# Patient Record
Sex: Female | Born: 1955 | Race: White | Hispanic: No | Marital: Single | Smoking: Never smoker
Health system: Southern US, Community
[De-identification: ages and names within clinical notes are randomized; demographics above are authoritative.]

## PROBLEM LIST (undated history)

## (undated) DIAGNOSIS — G8929 Other chronic pain: Secondary | ICD-10-CM

## (undated) DIAGNOSIS — T1491XA Suicide attempt, initial encounter: Secondary | ICD-10-CM

## (undated) DIAGNOSIS — Z765 Malingerer [conscious simulation]: Secondary | ICD-10-CM

## (undated) DIAGNOSIS — IMO0002 Reserved for concepts with insufficient information to code with codable children: Secondary | ICD-10-CM

## (undated) DIAGNOSIS — E785 Hyperlipidemia, unspecified: Secondary | ICD-10-CM

## (undated) DIAGNOSIS — F329 Major depressive disorder, single episode, unspecified: Secondary | ICD-10-CM

## (undated) DIAGNOSIS — F419 Anxiety disorder, unspecified: Secondary | ICD-10-CM

## (undated) HISTORY — DX: Reserved for concepts with insufficient information to code with codable children: IMO0002

## (undated) HISTORY — PX: ECTOPIC PREGNANCY SURGERY: SHX613

## (undated) HISTORY — DX: Anxiety disorder, unspecified: F41.9

## (undated) HISTORY — PX: CHOLECYSTECTOMY: SHX55

## (undated) HISTORY — DX: Hyperlipidemia, unspecified: E78.5

## (undated) HISTORY — PX: TONSILLECTOMY: SUR1361

---

## 2006-11-14 ENCOUNTER — Ambulatory Visit: Payer: Self-pay | Admitting: Obstetrics and Gynecology

## 2006-11-14 ENCOUNTER — Encounter: Payer: Self-pay | Admitting: Obstetrics and Gynecology

## 2006-11-14 ENCOUNTER — Other Ambulatory Visit: Admission: RE | Admit: 2006-11-14 | Discharge: 2006-11-14 | Payer: Self-pay | Admitting: Obstetrics and Gynecology

## 2006-11-20 ENCOUNTER — Ambulatory Visit (HOSPITAL_COMMUNITY): Admission: RE | Admit: 2006-11-20 | Discharge: 2006-11-20 | Payer: Self-pay | Admitting: Gynecology

## 2006-12-26 ENCOUNTER — Other Ambulatory Visit: Admission: RE | Admit: 2006-12-26 | Discharge: 2006-12-26 | Payer: Self-pay | Admitting: Obstetrics and Gynecology

## 2006-12-26 ENCOUNTER — Encounter: Payer: Self-pay | Admitting: Obstetrics & Gynecology

## 2006-12-26 ENCOUNTER — Ambulatory Visit: Payer: Self-pay | Admitting: Obstetrics & Gynecology

## 2007-01-16 ENCOUNTER — Ambulatory Visit: Payer: Self-pay | Admitting: Obstetrics & Gynecology

## 2007-11-07 ENCOUNTER — Ambulatory Visit: Payer: Self-pay | Admitting: Obstetrics & Gynecology

## 2007-11-07 ENCOUNTER — Encounter: Payer: Self-pay | Admitting: Obstetrics & Gynecology

## 2008-06-11 ENCOUNTER — Inpatient Hospital Stay (HOSPITAL_COMMUNITY): Admission: AD | Admit: 2008-06-11 | Discharge: 2008-06-11 | Payer: Self-pay | Admitting: Obstetrics and Gynecology

## 2008-07-19 IMAGING — US US TRANSVAGINAL NON-OB
1 series · 14 of 25 positions shown · non-contrast
Comparison: No prior studies are available for comparison.

CLINICAL DATA: 51-year-old with pelvic pain, passing clots. 
TRANSABDOMINAL AND TRANSVAGINAL PELVIC ULTRASOUND:
TECHNIQUE: Both transabdominal and transvaginal ultrasound examinations of the pelvis were performed including evaluation of the uterus, ovaries, adnexal regions, and pelvic cul-de-sac.

[Series 1: us transvaginal non-ob · 0.32mm/px · 14 of 34 slices shown]
[im 1/34]
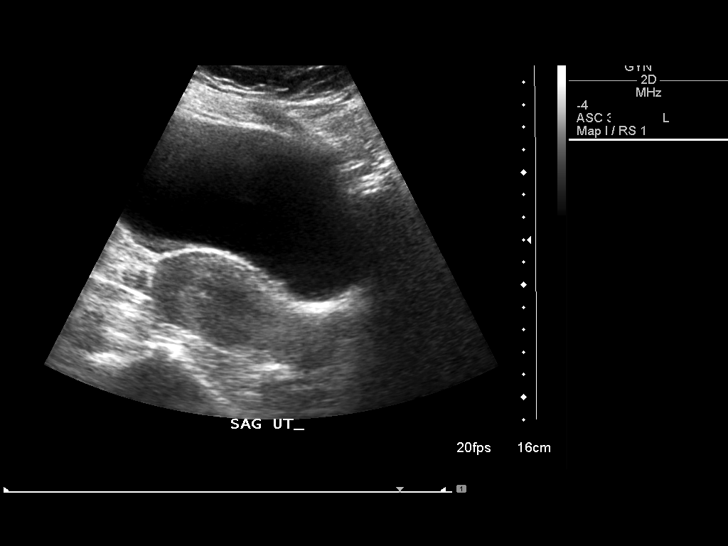
[im 3/34]
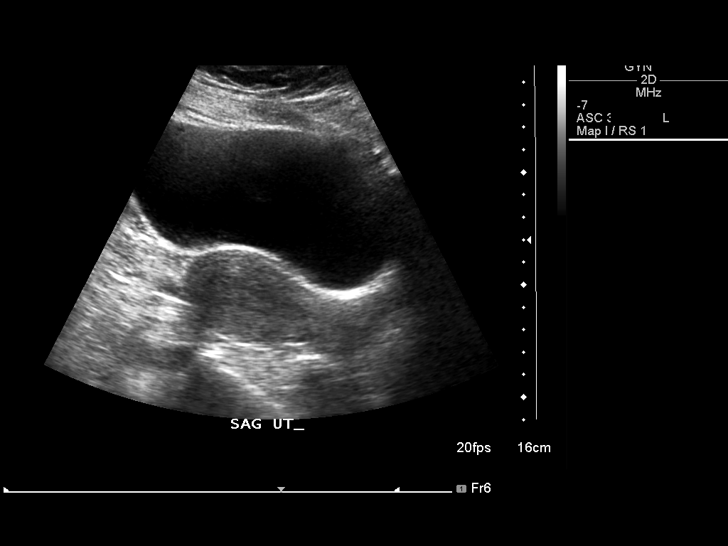
[im 6/34]
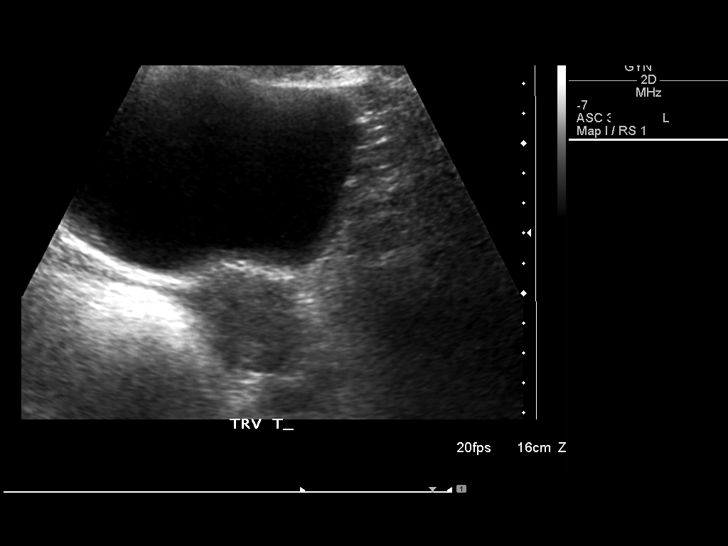
[im 9/34]
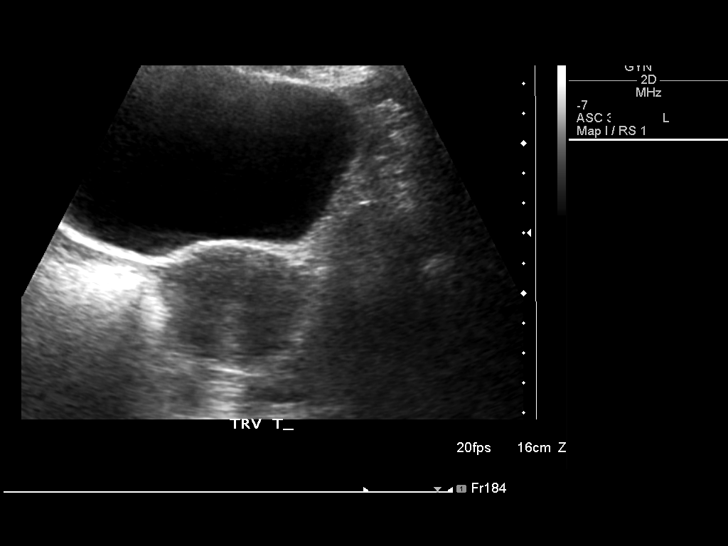
[im 12/34]
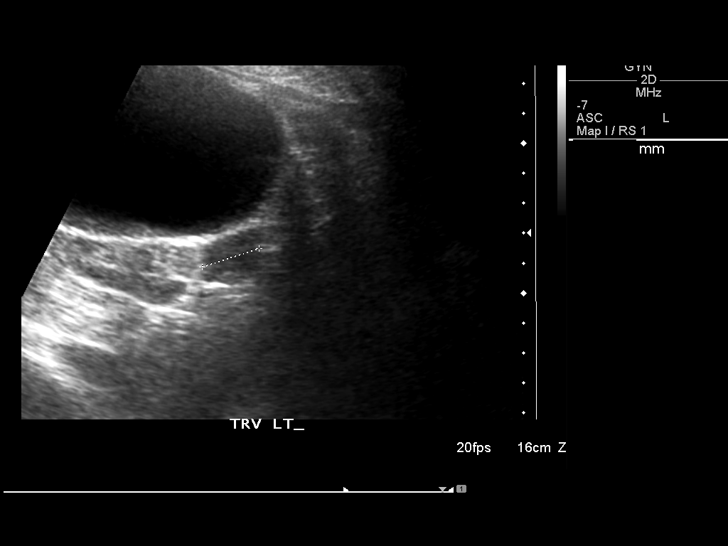
[im 13/34]
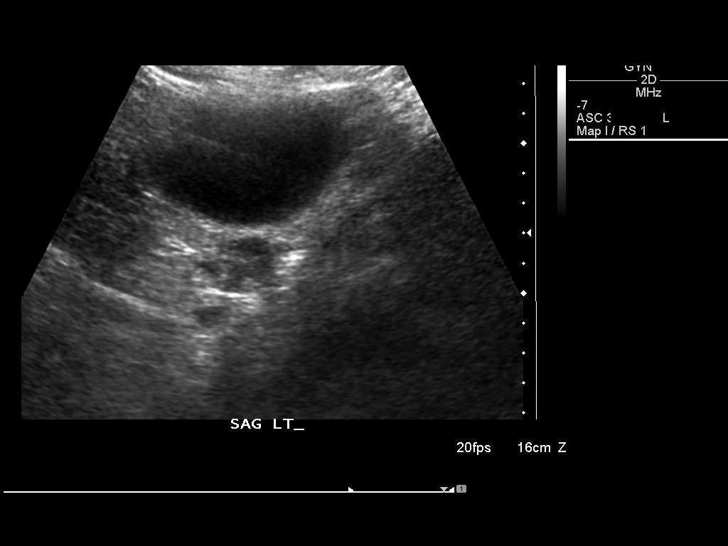
[im 16/34]
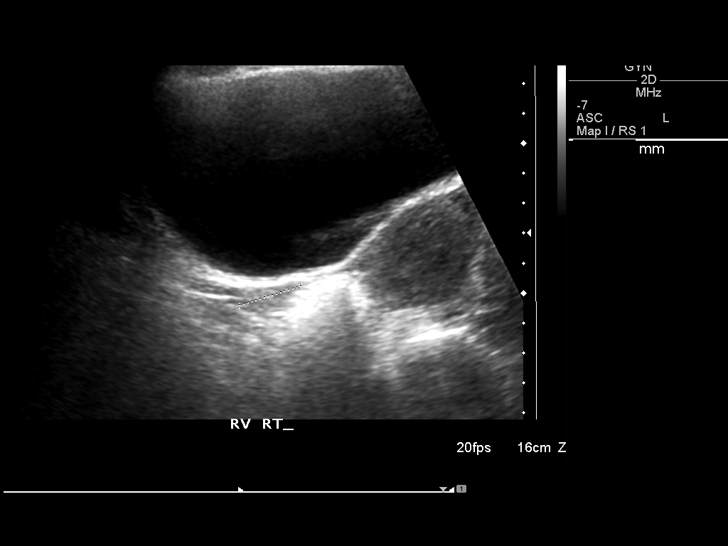
[im 18/34]
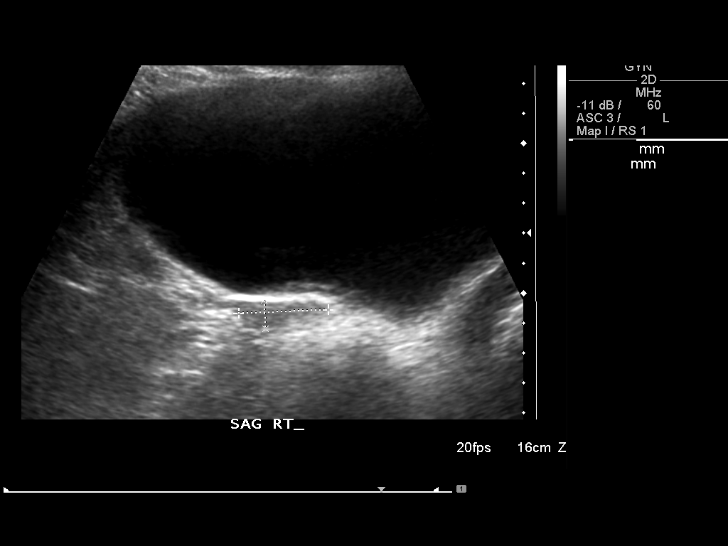
[im 21/34]
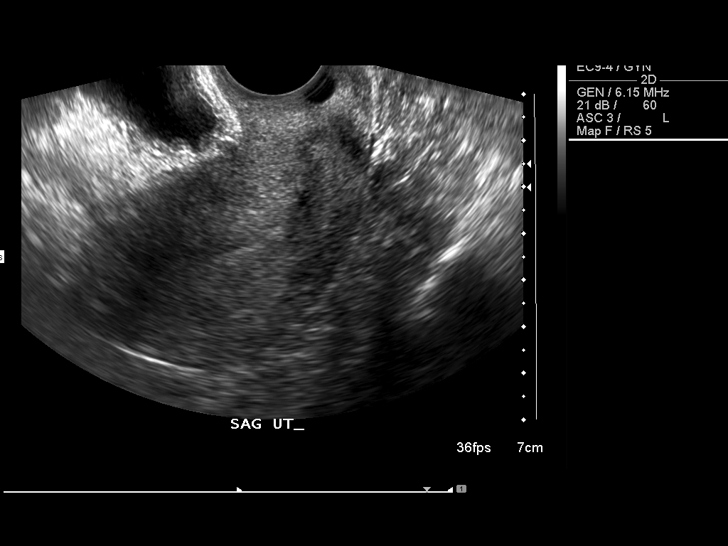
[im 23/34]
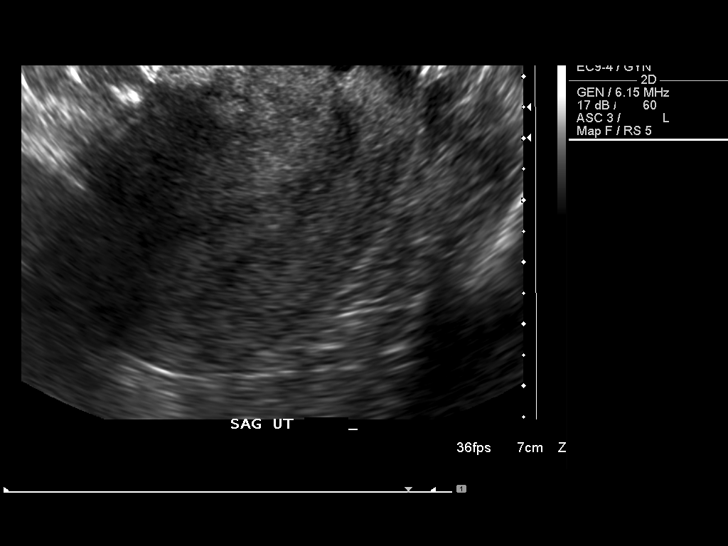
[im 25/34]
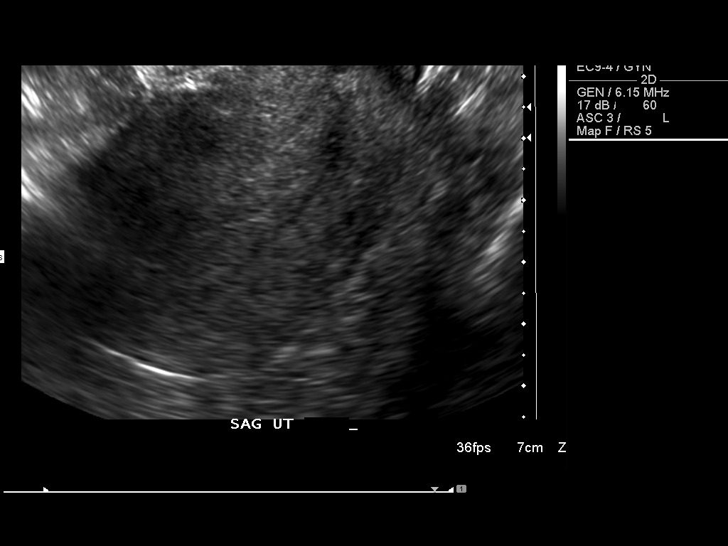
[im 28/34]
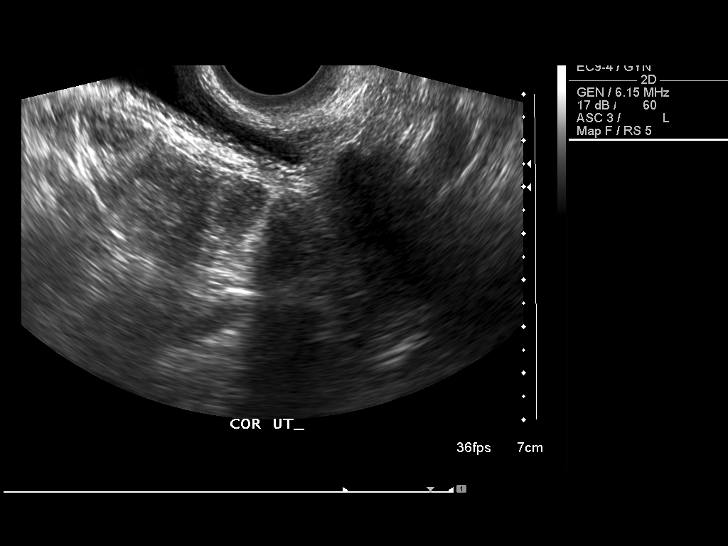
[im 31/34]
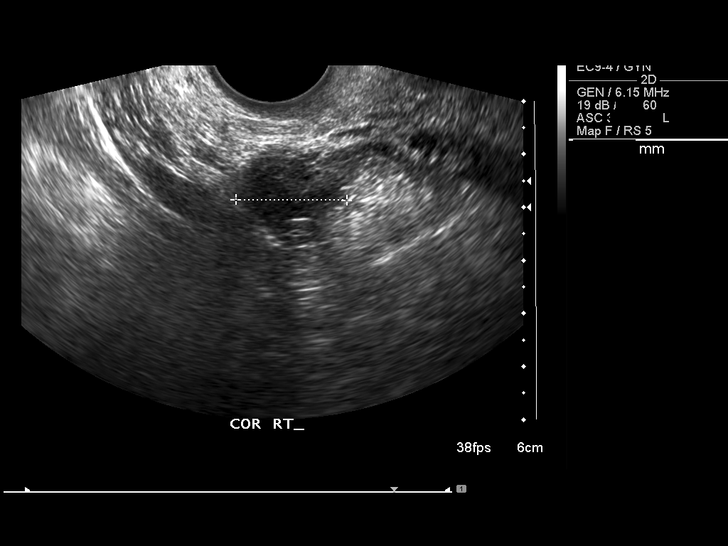
[im 34/34]
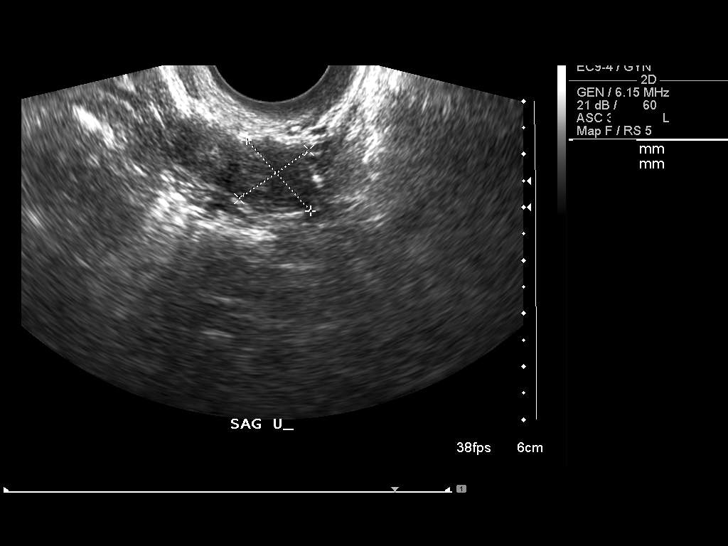

[14 of 25 positions shown; findings below may reference images not displayed]

FINDINGS: The uterus measures 8.1 cm in length x 4.6 cm AP diameter x 4.8 cm in width. The uterine contour is normal and no focal uterine masses are identified. The endometrium measures 9.3 mm in greatest thickness.
Both ovaries are normal in size and sonographic appearance measuring 1.9 x 1.8 x 2.0 cm on the right and 2.2 x 3.0 x 1.0 cm on the left.  No significant free fluid identified in the cul-de-sac.
IMPRESSION: 1.  The endometrial thickness is 9 mm.
2.  Normal sonographic appearances of the ovaries.

## 2009-07-14 ENCOUNTER — Ambulatory Visit: Payer: Self-pay | Admitting: Obstetrics & Gynecology

## 2009-07-14 LAB — CONVERTED CEMR LAB: Pap Smear: NEGATIVE

## 2010-03-05 ENCOUNTER — Encounter: Payer: Self-pay | Admitting: Internal Medicine

## 2010-03-05 ENCOUNTER — Encounter: Payer: Self-pay | Admitting: Obstetrics and Gynecology

## 2010-05-24 LAB — CBC
MCV: 91.2 fL (ref 78.0–100.0)
Platelets: 225 10*3/uL (ref 150–400)
RBC: 3.72 MIL/uL — ABNORMAL LOW (ref 3.87–5.11)
WBC: 7 10*3/uL (ref 4.0–10.5)

## 2010-06-27 NOTE — Group Therapy Note (Signed)
[name], [name]                 ACCOUNT NO.:  [id number]   MEDICAL RECORD NO.:  [id number]          PATIENT TYPE:  WOC   LOCATION:  [facility]                   FACILITY:  [facility]   PHYSICIAN:  Odie Sera, D.O.    DATE OF BIRTH:  03-18-1955   DATE OF SERVICE:  05/07/2008                                  CLINIC NOTE   The patient returns for a repeat Pap smear after abnormal Pap smear  consistent with ASCUS in 2008.  The patient reports that she is still  having abnormal periods but not currently on __________.  Her last  period was April 15, 2008.  Her last Pap smear was January 07, 2008, and  was normal.   VITALS:  Pulses 123, blood pressure 130/85, weight 167, height 4 feet 11  inches.   GENERAL EXAM:  The patient is in no apparent distress, pleasant, and  mildly overweight.  Pap smear not performed at this visit.  Pelvic exam  not performed at this visit.   ASSESSMENT AND PLAN:  This is a 55 year old female with history of  abnormal Pap smear consistent with ASCUS with 2 consecutive normal Pap  smears and a normal colposcopy.  The patient was discussed with both,  Dr. Debroah Loop and Dr. Noel Gerold, decided that with the normal repeat Pap smears  and the normal colposcopy, she does not need any repeat Pap smear at  this time.  The patient will not be charged for this visit.  She will  return in November 2010 for her annual Pap smear and pelvic exam at that  time.     ______________________________  [name]    ______________________________  Odie Sera, D.O.    DU/MEDQ  D:  05/07/2008  T:  05/08/2008  Job:  [id number]

## 2010-06-27 NOTE — Group Therapy Note (Signed)
[name], [name]                 ACCOUNT NO.:  [id number]   MEDICAL RECORD NO.:  [id number]          PATIENT TYPE:  WOC   LOCATION:  [facility]                   FACILITY:  [facility]   PHYSICIAN:  Argentina Donovan, MD        DATE OF BIRTH:  09/05/1955   DATE OF SERVICE:                                  CLINIC NOTE   This patient is a 55 year old Caucasian female gravida 1, para 0-0-1-0  with a history of an ectopic pregnancy years ago.  Has been sexually  inactive for the last 15 years and recently has been seen at the [facility] after having for many years been a patient of  Dr. __________ .  For the past 3 years she has had increasingly  difficult periods with heavy clotting and severe cramping but  continually she has had right lower quadrant pain down in the inguinal  area that is sharp but almost always there and not related to her cycle.  The Pap smear that the patient had had recently I am not sure whether it  was abnormal or not but apparently in a short note we got from Dr.  __________  indicates he wanted a colposcopy so it may very well be, we  are trying to obtain those records and if they are abnormal we will set  her up for colposcopy on her next visit when she comes back for follow-  up.  I did an endometrial biopsy today and in doing that I sounded the  uterus to 7 sonometers, retroverted.  This was not easy in this  nulliparous patient, but she tolerated it very well.  We got very little  tissue back and I expect that that is going to turn out to be normal.  I  Thought probably we would try and treat this lady, once we know that  everything is okay in the endometrium, with oral contraceptives or an  IUD to control her bleeding until menopause ensues.  However, the right  lower quadrant pain concerns me because she is tender to palpation in  that area.  I have had her laying down, standing up, coughing and I  cannot demonstrate a hernia in that particular  area, but the pain she is  demonstrating is very similar to what I would think would be manifested  with a superficial hernia and may be occult so the consideration for the  future about a laparoscopy I think is real.   IMPRESSION:  Right lower quadrant continual pain not related to cycle,  dysfunctional uterine bleeding probably hormonal and perimenopausal with  dysmenorrhea.  The patient is a nonsmoker.  She is 4 foot 10 inches  tall, weighs 162 pounds.   EXAMINATION:  ABDOMEN:  Soft, flat, nontender except in the inguinal  area on the right with no guarding or rebound.  EXTERNAL GENITALIA:  Normal, BUS within normal limits.  Vagina is clean  and well rugated.  The cervix clean, nulliparous.  Uterus is normal  size, shape, consistency and first degree retroversion.  The adnexa  could not be well palpated.  IMPRESSION:  Dysmenorrhea, dysfunctional uterine bleeding and right  lower quadrant steady pain not related to cycle.           ______________________________  Argentina Donovan, MD     PR/MEDQ  D:  11/14/2006  T:  11/15/2006  Job:  [id number]

## 2010-06-27 NOTE — Group Therapy Note (Signed)
[name], [name]                 ACCOUNT NO.:  [id number]   MEDICAL RECORD NO.:  [id number]          PATIENT TYPE:  WOC   LOCATION:  [facility]                   FACILITY:  [facility]   PHYSICIAN:  Johnella Moloney, MD        DATE OF BIRTH:  [birth year]   DATE OF SERVICE:  11/07/2007                                  CLINIC NOTE   REASON FOR OFFICE VISIT:  Repeat Pap smear status post normal Pap smear  in November 2008.   HISTORY:  The patient is a 55 year old perimenopausal female who  presents today for repeat Pap smear.  The patient had an abnormal Pap  previously and was set up for colposcopy in November 2008.  Coloposcopy  revealed no acetowhite lesions and no biopsy was necessary.  A repeat  Pap was obtained at that time and was negative for any intraepithelial  lesions.  An endometrial biopsy was also performed at that time and was  found to be within normal limits.  The patient returns today for  followup Pap smear for continued surveillance of positive screening test  that would indicate further need for cervical testing.  The patient  states that she has intermittent right lower quadrant pain.  That is  worsened when she obtains her periods.  She also notices that her  periods are becoming more and more irregular as she goes through her  perimenopausal state.  Again, endometrial biopsy was obtained in  November 2008 and found to be normal.   PHYSICAL EXAMINATION:  VITAL SIGNS:  Temperature is 98.0, pulse 108,  blood pressure 118/82, weight 168.8 pounds, and height is 69 inches.  GENERAL:  The patient is an obese, well-appearing Caucasian female in no  acute distress.  She is cooperative with the exam.  UROGENITAL:  External female anatomies within normal limits.  Vaginal  mucosa is pink and moist.  There is physiologic discharge within the  vaginal vault.  Cervix is slightly posterior and towards the patient's  right, but is found without difficult.  The os is closed and Pap smear  is obtained.  Bimanual examination reveals some right lower quadrant  tenderness on superficial palpation.  There is no adnexal mass on the  right or left side.  Uterus is within normal limits and is mobile and  nontender.  There is no cervical motion tenderness as well.  The patient  tolerated the exam without difficulty.   ASSESSMENT AND PLAN:  The patient is a 55 year old Caucasian female with  an abnormal Pap smear status post normal Pap in November 2008.  She  returns today for followup Pap smear for further monitoring with  previously abnormal Pap.  Coloposcopy was normal in November 2008 as  well.  We will notify the patient with the results of this Pap smear and  if normal, we will plan to repeat Pap in 6 months.  If that third and  final Pap smear is within normal limits, the patient can go back to  annual screening.  This had been discussed with the patient in detail.  She is in agreement with this plan.  DISPOSITION:  The patient is to follow up in 6 months for Pap smear  unless Pap smear performed today is abnormal and a colposcopy is  indicated.     ______________________________  Myrtie Soman, MD    ______________________________  Johnella Moloney, MD    TE/MEDQ  D:  11/07/2007  T:  11/08/2007  Job:  [id number]

## 2010-11-21 LAB — POCT PREGNANCY, URINE: Preg Test, Ur: NEGATIVE

## 2010-11-23 LAB — POCT PREGNANCY, URINE: Preg Test, Ur: NEGATIVE

## 2013-03-23 ENCOUNTER — Other Ambulatory Visit (HOSPITAL_COMMUNITY): Payer: Self-pay | Admitting: *Deleted

## 2013-03-23 DIAGNOSIS — Z1231 Encounter for screening mammogram for malignant neoplasm of breast: Secondary | ICD-10-CM

## 2013-04-09 ENCOUNTER — Ambulatory Visit: Payer: Self-pay | Admitting: Nurse Practitioner

## 2013-04-09 ENCOUNTER — Ambulatory Visit (HOSPITAL_COMMUNITY): Payer: Self-pay

## 2013-04-15 ENCOUNTER — Ambulatory Visit (HOSPITAL_COMMUNITY): Payer: Self-pay

## 2013-04-15 ENCOUNTER — Other Ambulatory Visit (HOSPITAL_COMMUNITY): Payer: Self-pay | Admitting: *Deleted

## 2013-04-15 DIAGNOSIS — Z1231 Encounter for screening mammogram for malignant neoplasm of breast: Secondary | ICD-10-CM

## 2013-05-28 ENCOUNTER — Ambulatory Visit: Payer: Self-pay | Admitting: Nurse Practitioner

## 2013-06-25 ENCOUNTER — Ambulatory Visit: Payer: Self-pay | Admitting: Medical

## 2013-08-06 ENCOUNTER — Ambulatory Visit (HOSPITAL_COMMUNITY)
Admission: RE | Admit: 2013-08-06 | Discharge: 2013-08-06 | Disposition: A | Discharge status: Home / Self Care | Payer: Medicare Other | Source: Ambulatory Visit | Attending: *Deleted | Admitting: *Deleted

## 2013-08-06 ENCOUNTER — Encounter: Payer: Self-pay | Admitting: Obstetrics & Gynecology

## 2013-08-06 ENCOUNTER — Ambulatory Visit (INDEPENDENT_AMBULATORY_CARE_PROVIDER_SITE_OTHER): Payer: Medicare Other | Admitting: Obstetrics & Gynecology

## 2013-08-06 ENCOUNTER — Other Ambulatory Visit (HOSPITAL_COMMUNITY)
Admission: RE | Admit: 2013-08-06 | Discharge: 2013-08-06 | Disposition: A | Discharge status: Home / Self Care | Payer: Medicare Other | Source: Ambulatory Visit | Attending: Obstetrics & Gynecology | Admitting: Obstetrics & Gynecology

## 2013-08-06 VITALS — BP 117/80 | HR 103 | Ht 59.0 in | Wt 156.6 lb

## 2013-08-06 DIAGNOSIS — Z0000 Encounter for general adult medical examination without abnormal findings: Secondary | ICD-10-CM

## 2013-08-06 DIAGNOSIS — Z1231 Encounter for screening mammogram for malignant neoplasm of breast: Secondary | ICD-10-CM

## 2013-08-06 DIAGNOSIS — Z1151 Encounter for screening for human papillomavirus (HPV): Secondary | ICD-10-CM | POA: Insufficient documentation

## 2013-08-06 DIAGNOSIS — Z124 Encounter for screening for malignant neoplasm of cervix: Secondary | ICD-10-CM | POA: Insufficient documentation

## 2013-08-06 NOTE — Addendum Note (Signed)
Addended by: Faythe CasaBELLAMY, JEANETTA M on: 08/06/2013 04:32 PM   Modules accepted: Orders

## 2013-08-06 NOTE — Progress Notes (Signed)
Subjective:    Natalie KendallLinda Doyle is a 58 y.o. female who presents for an annual exam. The patient has no complaints today. The patient is not currently sexually active. GYN screening history: last pap: was normal. The patient wears seatbelts: yes. The patient participates in regular exercise: no. Has the patient ever been transfused or tattooed?: not asked. The patient reports that there is not domestic violence in her life.   Menstrual History: OB History   Grav Para Term Preterm Abortions TAB SAB Ect Mult Living   1    1   1   0      Menarche age: [age]  No LMP recorded. Patient is postmenopausal.    The following portions of the patient's history were reviewed and updated as appropriate: allergies, current medications, past family history, past medical history, past social history, past surgical history and problem list.  Review of Systems A comprehensive review of systems was negative.  Her mammogram is today.   Objective:    BP 117/80  Pulse 103  Ht 4\' 11"  (1.499 m)  Wt 156 lb 9.6 oz (71.033 kg)  BMI 31.61 kg/m2  General Appearance:    Alert, cooperative, no distress, appears stated age  Head:    Normocephalic, without obvious abnormality, atraumatic  Eyes:    PERRL, conjunctiva/corneas clear, EOM's intact, fundi    benign, both eyes  Ears:    Normal TM's and external ear canals, both ears  Nose:   Nares normal, septum midline, mucosa normal, no drainage    or sinus tenderness  Throat:   Lips, mucosa, and tongue normal; teeth and gums normal  Neck:   Supple, symmetrical, trachea midline, no adenopathy;    thyroid:  no enlargement/tenderness/nodules; no carotid   bruit or JVD  Back:     Symmetric, no curvature, ROM normal, no CVA tenderness  Lungs:     Clear to auscultation bilaterally, respirations unlabored  Chest Wall:    No tenderness or deformity   Heart:    Regular rate and rhythm, S1 and S2 normal, no murmur, rub   or gallop  Breast Exam:    No tenderness, masses, or nipple  abnormality  Abdomen:     Soft, non-tender, bowel sounds active all four quadrants,    no masses, no organomegaly, morbid centripital obesity  Genitalia:    Normal female without lesion, discharge or tenderness, no pelvic masses appreciated     Extremities:   Extremities normal, atraumatic, no cyanosis or edema  Pulses:   2+ and symmetric all extremities  Skin:   Skin color, texture, turgor normal, no rashes or lesions  Lymph nodes:   Cervical, supraclavicular, and axillary nodes normal  Neurologic:   CNII-XII intact, normal strength, sensation and reflexes    throughout  .    Assessment:    Healthy female exam.    Plan:     Breast self exam technique reviewed and patient encouraged to perform self-exam monthly. Mammogram. Thin prep Pap smear. with cotesting

## 2013-08-07 LAB — CYTOLOGY - PAP

## 2013-08-10 ENCOUNTER — Other Ambulatory Visit: Payer: Self-pay | Admitting: *Deleted

## 2013-08-10 DIAGNOSIS — N63 Unspecified lump in unspecified breast: Secondary | ICD-10-CM

## 2013-08-27 ENCOUNTER — Other Ambulatory Visit: Payer: Medicare Other

## 2013-12-14 ENCOUNTER — Encounter: Payer: Self-pay | Admitting: Obstetrics & Gynecology

## 2017-10-24 ENCOUNTER — Emergency Department (HOSPITAL_BASED_OUTPATIENT_CLINIC_OR_DEPARTMENT_OTHER)
Admission: EM | Admit: 2017-10-24 | Discharge: 2017-10-24 | Disposition: A | Discharge status: Home / Self Care | Payer: Medicare Other | Attending: Emergency Medicine | Admitting: Emergency Medicine

## 2017-10-24 ENCOUNTER — Other Ambulatory Visit: Payer: Self-pay

## 2017-10-24 ENCOUNTER — Encounter (HOSPITAL_BASED_OUTPATIENT_CLINIC_OR_DEPARTMENT_OTHER): Payer: Self-pay

## 2017-10-24 DIAGNOSIS — M79603 Pain in arm, unspecified: Secondary | ICD-10-CM | POA: Diagnosis present

## 2017-10-24 DIAGNOSIS — Z79899 Other long term (current) drug therapy: Secondary | ICD-10-CM | POA: Insufficient documentation

## 2017-10-24 DIAGNOSIS — G8929 Other chronic pain: Secondary | ICD-10-CM | POA: Diagnosis not present

## 2017-10-24 HISTORY — DX: Major depressive disorder, single episode, unspecified: F32.9

## 2017-10-24 HISTORY — DX: Malingerer (conscious simulation): Z76.5

## 2017-10-24 HISTORY — DX: Other chronic pain: G89.29

## 2017-10-24 HISTORY — DX: Suicide attempt, initial encounter: T14.91XA

## 2017-10-24 NOTE — ED Provider Notes (Signed)
[facility] EMERGENCY DEPARTMENT Provider Note  CSN: [id number] Arrival date & time: 10/24/17  1824    History   Chief Complaint Chief Complaint  Patient presents with  . Pain    HPI [name] is a 62 y.o. female with a medical history of chronic pain (2/2 fibromyalgia, DDD and OA) and psychiatric history of MDD and anxiety who presented to the ED requesting pain medications. Patient complains of pain, especially in her upper extremities which is chronic and unchanged. She describes it as throbbing, aching and burning. Denies recent trauma, injuries or falls. No worsening factors, but reports past relief with opiates. Patient spends most of the conversation discussing how and why she needs opiates and that she was discharged from her pain clinic last month in 09/2017. Recently ran out of her pain medication last week.   Past Medical History:  Diagnosis Date  . Anxiety   . Chronic pain   . Degenerative disc disease   . Drug-seeking behavior   . Hyperlipidemia   . Major depressive disorder   . Suicide attempt (HCC)     There are no active problems to display for this patient.   Past Surgical History:  Procedure Laterality Date  . CHOLECYSTECTOMY    . ECTOPIC PREGNANCY SURGERY    . TONSILLECTOMY       OB History    Gravida  1   Para      Term      Preterm      AB  1   Living  0     SAB      TAB      Ectopic  1   Multiple      Live Births               Home Medications    Prior to Admission medications   Medication Sig Start Date End Date Taking? Authorizing Provider  venlafaxine XR (EFFEXOR-XR) 75 MG 24 hr capsule Take 75 mg by mouth daily with breakfast.   Yes [provider]  atorvastatin (LIPITOR) 40 MG tablet Take 40 mg by mouth daily.    [provider]  oxyCODONE-acetaminophen (PERCOCET/ROXICET) 5-325 MG per tablet Take 1-2 tablets by mouth every 6 (six) hours as needed for severe pain.    [provider]    Family History Family History  Problem Relation Age of Onset  . Hyperlipidemia Mother   . COPD Mother     Social History Social History   Tobacco Use  . Smoking status: Never Smoker  . Smokeless tobacco: Never Used  Substance Use Topics  . Alcohol use: No  . Drug use: No     Allergies   Penicillins and Sulfa antibiotics   Review of Systems Review of Systems  Constitutional: Negative.   HENT: Negative.   Respiratory: Negative.   Cardiovascular: Negative.   Gastrointestinal: Negative.   Genitourinary: Negative.   Musculoskeletal: Positive for arthralgias and myalgias.  Skin: Negative.   Neurological: Negative.   Hematological: Negative.   Psychiatric/Behavioral: The patient is nervous/anxious.      Physical Exam Updated Vital Signs BP (!) 142/90 (BP Location: Left Arm)   Pulse (!) 112   Temp 98.4 F (36.9 C) (Oral)   Resp 18   Ht 4\' 11"  (1.499 m)   Wt 69.4 kg   SpO2 99%   BMI 30.90 kg/m   Physical Exam  Constitutional: She appears well-developed and well-nourished. No distress.  Neck: Normal range of  motion and full passive range of motion without pain. Neck supple.  Cardiovascular: Normal rate, regular rhythm and normal heart sounds.  Pulmonary/Chest: Effort normal and breath sounds normal.  Musculoskeletal:  Full ROM of upper and lower extremities with 5/5 strength. Muscular tenderness of right forearm.   Neurological: She has normal strength. No sensory deficit. She exhibits normal muscle tone.  Skin: Skin is warm and intact. Capillary refill takes less than 2 seconds.  Nursing note and vitals reviewed.  ED Treatments / Results  Labs (all labs ordered are listed, but only abnormal results are displayed) Labs Reviewed - No data to display  EKG None  Radiology No results found.  Procedures Procedures (including critical care time)  Medications Ordered in ED Medications - No data to display   Initial Impression / Assessment and  Plan / ED Course  Triage vital signs and the nursing notes have been reviewed.  Pertinent labs & imaging results that were available during care of the patient were reviewed and considered in medical decision making (see chart for details).   Patient presents to the ED with chronic complaints and requesting pain medications, specifically opiates. Before provider interview, she was informed that legally pain medications cannot be prescribed from the ED for chronic pain. However, patient continues to ask and goes into great detail about her past medical and psychiatric history that required her to be on Percocet and Xanax simultaneously. She was recently discharged from her pain clinic for being on a benzo while taking an opiate. She states that she has been referred to a different pain clinic, but has been unable to be seen by them. Physical exam is unremarkable. Thorough explanation was given to why opiates are not going to be prescribed today. She then asks "Well what can you give me then?" and proceeds to decline all non-opiate medication offered. Patient seen earlier today at urgent care for the same thing.   Final Clinical Impressions(s) / ED Diagnoses  1. Chronic Pain. Will not describe opiates and thorough education was given on appropriate follow-up with PCP.  Dispo: Home. After thorough clinical evaluation, this patient is determined to be medically stable and can be safely discharged with the previously mentioned treatment and/or outpatient follow-up/referral(s). At this time, there are no other apparent medical conditions that require further screening, evaluation or treatment.   Final diagnoses:  Other chronic pain    ED Discharge Orders    None        Reva Bores 10/24/17 2107    Melene Plan, DO 10/24/17 2317

## 2017-10-24 NOTE — ED Triage Notes (Addendum)
Pt c/o "chronic pain"-states she has been out of pain meds x 1 week-states she went to pain clinic in August with a new provider and rx oxycodone would not be refilled and fired from the clinic due to she had taken xanax with opioids in the past-NAD-steady gait

## 2017-10-24 NOTE — Discharge Instructions (Addendum)
We were unable to find contact information for Dr. Genella Mechainwater.   As we discussed, we will not be refilling your pain medication today.   Please continue your follow-up with your PCP regarding medications.

## 2017-10-24 NOTE — ED Notes (Signed)
Pt wanting xanax prescription-last prescription filled in July. Reports her PCP left practice.

## 2017-10-24 NOTE — ED Notes (Signed)
ED Provider at bedside. 

## 2023-02-13 DEATH — deceased
